# Patient Record
Sex: Female | Born: 1990 | Race: Black or African American | Hispanic: No | Marital: Single
Health system: Southern US, Community
[De-identification: ages and names within clinical notes are randomized; demographics above are authoritative.]

---

## 2008-05-16 ENCOUNTER — Ambulatory Visit (HOSPITAL_COMMUNITY): Admission: RE | Admit: 2008-05-16 | Discharge: 2008-05-16 | Payer: Self-pay | Admitting: Obstetrics and Gynecology

## 2011-02-16 ENCOUNTER — Other Ambulatory Visit (HOSPITAL_COMMUNITY)
Admission: RE | Admit: 2011-02-16 | Discharge: 2011-02-16 | Disposition: A | Discharge status: Home / Self Care | Payer: BC Managed Care – PPO | Source: Ambulatory Visit | Attending: Obstetrics and Gynecology | Admitting: Obstetrics and Gynecology

## 2011-02-16 DIAGNOSIS — Z113 Encounter for screening for infections with a predominantly sexual mode of transmission: Secondary | ICD-10-CM | POA: Insufficient documentation

## 2011-02-16 DIAGNOSIS — Z01419 Encounter for gynecological examination (general) (routine) without abnormal findings: Secondary | ICD-10-CM | POA: Insufficient documentation

## 2019-05-21 ENCOUNTER — Other Ambulatory Visit (HOSPITAL_COMMUNITY)
Admission: RE | Admit: 2019-05-21 | Discharge: 2019-05-21 | Disposition: A | Discharge status: Home / Self Care | Payer: Self-pay | Source: Ambulatory Visit | Attending: Obstetrics and Gynecology | Admitting: Obstetrics and Gynecology

## 2019-05-21 ENCOUNTER — Other Ambulatory Visit: Payer: Self-pay | Admitting: Obstetrics and Gynecology

## 2019-05-21 DIAGNOSIS — Z124 Encounter for screening for malignant neoplasm of cervix: Secondary | ICD-10-CM | POA: Insufficient documentation

## 2019-05-28 LAB — CYTOLOGY - PAP
Comment: NEGATIVE
Diagnosis: NEGATIVE
High risk HPV: NEGATIVE

## 2020-05-28 ENCOUNTER — Other Ambulatory Visit: Payer: Self-pay | Admitting: Obstetrics and Gynecology

## 2020-05-28 DIAGNOSIS — N6320 Unspecified lump in the left breast, unspecified quadrant: Secondary | ICD-10-CM

## 2020-06-19 ENCOUNTER — Other Ambulatory Visit: Payer: Self-pay | Admitting: Obstetrics and Gynecology

## 2020-06-19 ENCOUNTER — Other Ambulatory Visit: Payer: Self-pay

## 2020-06-19 ENCOUNTER — Ambulatory Visit
Admission: RE | Admit: 2020-06-19 | Discharge: 2020-06-19 | Disposition: A | Discharge status: Home / Self Care | Payer: Medicaid Other | Source: Ambulatory Visit | Attending: Obstetrics and Gynecology | Admitting: Obstetrics and Gynecology

## 2020-06-19 ENCOUNTER — Ambulatory Visit
Admission: RE | Admit: 2020-06-19 | Discharge: 2020-06-19 | Disposition: A | Discharge status: Home / Self Care | Payer: No Typology Code available for payment source | Source: Ambulatory Visit | Attending: Obstetrics and Gynecology | Admitting: Obstetrics and Gynecology

## 2020-06-19 DIAGNOSIS — N6310 Unspecified lump in the right breast, unspecified quadrant: Secondary | ICD-10-CM

## 2020-06-19 DIAGNOSIS — N6320 Unspecified lump in the left breast, unspecified quadrant: Secondary | ICD-10-CM

## 2020-06-19 DIAGNOSIS — R599 Enlarged lymph nodes, unspecified: Secondary | ICD-10-CM

## 2020-06-30 ENCOUNTER — Other Ambulatory Visit: Payer: Self-pay | Admitting: Obstetrics and Gynecology

## 2020-06-30 DIAGNOSIS — R599 Enlarged lymph nodes, unspecified: Secondary | ICD-10-CM

## 2020-06-30 DIAGNOSIS — N6320 Unspecified lump in the left breast, unspecified quadrant: Secondary | ICD-10-CM

## 2020-07-06 ENCOUNTER — Other Ambulatory Visit: Payer: Self-pay

## 2020-07-06 ENCOUNTER — Other Ambulatory Visit: Payer: Self-pay | Admitting: Obstetrics and Gynecology

## 2020-07-06 ENCOUNTER — Ambulatory Visit
Admission: RE | Admit: 2020-07-06 | Discharge: 2020-07-06 | Disposition: A | Discharge status: Home / Self Care | Payer: No Typology Code available for payment source | Source: Ambulatory Visit | Attending: Obstetrics and Gynecology | Admitting: Obstetrics and Gynecology

## 2020-07-06 DIAGNOSIS — R599 Enlarged lymph nodes, unspecified: Secondary | ICD-10-CM

## 2020-07-06 DIAGNOSIS — N6320 Unspecified lump in the left breast, unspecified quadrant: Secondary | ICD-10-CM

## 2020-07-06 DIAGNOSIS — N6001 Solitary cyst of right breast: Secondary | ICD-10-CM

## 2020-10-07 ENCOUNTER — Other Ambulatory Visit: Payer: Self-pay

## 2020-10-07 ENCOUNTER — Ambulatory Visit
Admission: RE | Admit: 2020-10-07 | Discharge: 2020-10-07 | Disposition: A | Discharge status: Home / Self Care | Payer: No Typology Code available for payment source | Source: Ambulatory Visit | Attending: Obstetrics and Gynecology | Admitting: Obstetrics and Gynecology

## 2020-10-07 ENCOUNTER — Other Ambulatory Visit: Payer: Self-pay | Admitting: Obstetrics and Gynecology

## 2020-10-07 DIAGNOSIS — R599 Enlarged lymph nodes, unspecified: Secondary | ICD-10-CM

## 2020-10-07 DIAGNOSIS — N6001 Solitary cyst of right breast: Secondary | ICD-10-CM

## 2021-01-08 ENCOUNTER — Other Ambulatory Visit: Payer: Self-pay | Admitting: Obstetrics and Gynecology

## 2021-01-08 ENCOUNTER — Ambulatory Visit
Admission: RE | Admit: 2021-01-08 | Discharge: 2021-01-08 | Disposition: A | Discharge status: Home / Self Care | Payer: No Typology Code available for payment source | Source: Ambulatory Visit | Attending: Obstetrics and Gynecology | Admitting: Obstetrics and Gynecology

## 2021-01-08 ENCOUNTER — Other Ambulatory Visit: Payer: Self-pay

## 2021-01-08 DIAGNOSIS — R599 Enlarged lymph nodes, unspecified: Secondary | ICD-10-CM

## 2021-01-08 DIAGNOSIS — N6310 Unspecified lump in the right breast, unspecified quadrant: Secondary | ICD-10-CM

## 2021-04-16 ENCOUNTER — Other Ambulatory Visit: Payer: Self-pay

## 2021-04-16 ENCOUNTER — Ambulatory Visit
Admission: RE | Admit: 2021-04-16 | Discharge: 2021-04-16 | Disposition: A | Discharge status: Home / Self Care | Payer: No Typology Code available for payment source | Source: Ambulatory Visit | Attending: Obstetrics and Gynecology | Admitting: Obstetrics and Gynecology

## 2021-04-16 DIAGNOSIS — R599 Enlarged lymph nodes, unspecified: Secondary | ICD-10-CM

## 2021-06-03 ENCOUNTER — Telehealth: Payer: Self-pay | Admitting: Genetic Counselor

## 2021-06-03 NOTE — Telephone Encounter (Signed)
Scheduled appt per 10/25 referral. Pt is aware of appt date and time.  

## 2021-07-05 NOTE — Progress Notes (Signed)
REFERRING PROVIDER: Drema Dallas, DO [address],  [place] [address]  PRIMARY PROVIDER:  Patient, No Pcp Per (Inactive)  PRIMARY REASON FOR VISIT:  Encounter Diagnoses  Name Primary?   Family history of breast cancer    Family history of colon cancer in father      HISTORY OF PRESENT ILLNESS:   Ms. Pownall, a 30 y.o. female, was seen for a [place] cancer genetics consultation at the request of Dr. Delora Fuel due to a family history of cancer.  Ms. Furniss presents to clinic today to discuss the possibility of a hereditary predisposition to cancer, to discuss genetic testing, and to further clarify her future cancer risks, as well as potential cancer risks for family members.   Ms. Hornbeck is a 30 y.o. female with no personal history of cancer.    CANCER HISTORY:  Oncology History   No history exists.    No past medical history on file.   Social History   Socioeconomic History   Marital status: Single    Spouse name: Not on file   Number of children: Not on file   Years of education: Not on file   Highest education level: Not on file  Occupational History   Not on file  Tobacco Use   Smoking status: Not on file   Smokeless tobacco: Not on file  Substance and Sexual Activity   Alcohol use: Not on file   Drug use: Not on file   Sexual activity: Not on file  Other Topics Concern   Not on file  Social History Narrative   Not on file   Social Determinants of Health   Financial Resource Strain: Not on file  Food Insecurity: Not on file  Transportation Needs: Not on file  Physical Activity: Not on file  Stress: Not on file  Social Connections: Not on file     FAMILY HISTORY:  We obtained a detailed, 4-generation family history.  Significant diagnoses are listed below: Family History  Problem Relation Age of Onset   Colon cancer Father    Breast cancer Maternal Grandmother        Ms. Vaquerano father was diagnosed  with stage IV colon cancer at age 67 and died due to the colon cancer months later. Her maternal grandmother was diagnosed with breast cancer in her late 50s/early 92s.  Ms. Zinda is unaware of previous family history of genetic testing for hereditary cancer risks. There is no reported Ashkenazi Jewish ancestry.   GENETIC COUNSELING ASSESSMENT: Ms. Shanley is a 30 y.o. female with a family history of cancer which is somewhat suggestive of a hereditary predisposition to cancer. We, therefore, discussed and recommended the following at today's visit.   DISCUSSION: We discussed that 5 - 10% of cancer is hereditary and there are many genes that can be associated with hereditary colon and breast cancer syndromes.  We discussed that testing is beneficial for several reasons, including knowing about other cancer risks, identifying potential screening and risk-reduction options that may be appropriate, and to understanding if other family members could be at risk for cancer and allowing them to undergo genetic testing.  We reviewed the characteristics, features and inheritance patterns of hereditary cancer syndromes. We also discussed genetic testing, including the appropriate family members to test, the process of testing, insurance coverage and turn-around-time for results. We discussed the implications of a negative, positive, carrier and/or variant of uncertain significant result. We discussed that negative results would be uninformative  given that Ms. Ferreras does not have a personal history of cancer.   Ms. Yorks was offered a common hereditary cancer panel (47 genes) and an expanded pan-cancer panel (84 genes). Ms. Salah was informed of the benefits and limitations of each panel, including that expanded pan-cancer panels contain several genes that do not have clear management guidelines at this point in time.  We also discussed that as the number of genes included on a panel  increases, the chances of variants of uncertain significance increases.  After considering the benefits and limitations of each gene panel, Ms. Olexa elected to have an [place] Panel+RNA.  The CancerNext-Expanded gene panel offered by [facility] and includes sequencing, rearrangement, and RNA analysis for the following 77 genes: AIP, ALK, APC, ATM, AXIN2, BAP1, BARD1, BLM, BMPR1A, BRCA1, BRCA2, BRIP1, CDC73, CDH1, CDK4, CDKN1B, CDKN2A, CHEK2, CTNNA1, DICER1, FANCC, FH, FLCN, GALNT12, KIF1B, LZTR1, MAX, MEN1, MET, MLH1, MSH2, MSH3, MSH6, MUTYH, NBN, NF1, NF2, NTHL1, PALB2, PHOX2B, PMS2, POT1, PRKAR1A, PTCH1, PTEN, RAD51C, RAD51D, RB1, RECQL, RET, SDHA, SDHAF2, SDHB, SDHC, SDHD, SMAD4, SMARCA4, SMARCB1, SMARCE1, STK11, SUFU, TMEM127, TP53, TSC1, TSC2, VHL and XRCC2 (sequencing and deletion/duplication); EGFR, EGLN1, HOXB13, KIT, MITF, PDGFRA, POLD1, and POLE (sequencing only); EPCAM and GREM1 (deletion/duplication only).   We discussed with Ms. Reigel that the family history does not meet insurance or NCCN criteria for genetic testing and, therefore, is not highly consistent with a familial hereditary cancer syndrome.  We feel she is at low risk to harbor a gene mutation associated with such a condition. However, Ms. Perrault was still interested in genetic testing. As her father has passed and we are unable to test him, it is reasonable for her to consider genetic testing.   We discussed that if her out of pocket cost for testing is over $100, the laboratory will call and confirm whether she wants to proceed with testing.  If the out of pocket cost of testing is less than $100 she will be billed by the genetic testing laboratory.   We discussed that some people do not want to undergo genetic testing due to fear of genetic discrimination.  A federal law called the Genetic Information Non-Discrimination Act (GINA) of 2008 helps protect individuals against genetic  discrimination based on their genetic test results.  It impacts both health insurance and employment.  With health insurance, it protects against increased premiums, being kicked off insurance or being forced to take a test in order to be insured.  For employment it protects against hiring, firing and promoting decisions based on genetic test results.  GINA does not apply to those in the [employer], those who work for companies with less than 15 employees, and new life insurance or long-term disability insurance policies.  Health status due to a cancer diagnosis is not protected under GINA.  PLAN: After considering the risks, benefits, and limitations, Ms. Holik provided informed consent to pursue genetic testing and the blood sample was sent to [facility] for analysis of the CancerNext-Expanded Panel+RNA. Results should be available within approximately 2-3 weeks' time, at which point they will be disclosed by telephone to Ms. Willadsen, as will any additional recommendations warranted by these results. Ms. Covin will receive a summary of her genetic counseling visit and a copy of her results once available. This information will also be available in Epic.   Ms. Deemer questions were answered to her satisfaction today. Our contact information was provided should additional questions or concerns arise. Thank you for  the referral and allowing [place] to share in the care of your patient.   Lucille Passy, MS, [facility] Genetic Counselor [email or web address] (P) [phone]  The patient was seen for a total of 30 minutes in face-to-face genetic counseling. The patient was seen alone.  Drs. Magrinat, Lindi Adie and/or Burr Medico were available to discuss this case as needed.  _______________________________________________________________________ For Office Staff:  Number of people involved in session: 1 Was an Intern/ student involved with case: no

## 2021-07-06 ENCOUNTER — Inpatient Hospital Stay: Payer: No Typology Code available for payment source

## 2021-07-06 ENCOUNTER — Other Ambulatory Visit: Payer: Self-pay

## 2021-07-06 ENCOUNTER — Inpatient Hospital Stay: Payer: No Typology Code available for payment source | Attending: Genetic Counselor | Admitting: Genetic Counselor

## 2021-07-06 DIAGNOSIS — Z800 Family history of malignant neoplasm of digestive organs: Secondary | ICD-10-CM

## 2021-07-06 DIAGNOSIS — Z803 Family history of malignant neoplasm of breast: Secondary | ICD-10-CM

## 2021-07-06 LAB — GENETIC SCREENING ORDER

## 2021-07-07 ENCOUNTER — Encounter: Payer: Self-pay | Admitting: Genetic Counselor

## 2021-07-07 DIAGNOSIS — Z803 Family history of malignant neoplasm of breast: Secondary | ICD-10-CM | POA: Insufficient documentation

## 2021-07-07 DIAGNOSIS — Z800 Family history of malignant neoplasm of digestive organs: Secondary | ICD-10-CM | POA: Insufficient documentation

## 2021-07-16 ENCOUNTER — Ambulatory Visit
Admission: RE | Admit: 2021-07-16 | Discharge: 2021-07-16 | Disposition: A | Discharge status: Home / Self Care | Payer: No Typology Code available for payment source | Source: Ambulatory Visit | Attending: Obstetrics and Gynecology | Admitting: Obstetrics and Gynecology

## 2021-07-16 DIAGNOSIS — N6310 Unspecified lump in the right breast, unspecified quadrant: Secondary | ICD-10-CM

## 2021-07-19 ENCOUNTER — Encounter: Payer: Self-pay | Admitting: Genetic Counselor

## 2021-07-19 ENCOUNTER — Telehealth: Payer: Self-pay | Admitting: Genetic Counselor

## 2021-07-19 DIAGNOSIS — Z1379 Encounter for other screening for genetic and chromosomal anomalies: Secondary | ICD-10-CM | POA: Insufficient documentation

## 2021-07-19 NOTE — Telephone Encounter (Signed)
I contacted Ms. Whirley to discuss her genetic testing results. No pathogenic variants were identified in the 77 genes analyzed.   The test report has been scanned into EPIC and is located under the Molecular Pathology section of the Results Review tab.  A portion of the result report is included below for reference. Detailed clinic note to follow.  Lalla Brothers, MS, [facility] Genetic Counselor [email or web address] (P) [phone]

## 2021-07-20 ENCOUNTER — Ambulatory Visit: Payer: Self-pay | Admitting: Genetic Counselor

## 2021-07-20 DIAGNOSIS — Z1379 Encounter for other screening for genetic and chromosomal anomalies: Secondary | ICD-10-CM

## 2021-07-20 NOTE — Progress Notes (Signed)
HPI:   Angel Stanton was previously seen in the [place] clinic due to a family history of cancer and concerns regarding a hereditary predisposition to cancer. Please refer to our prior cancer genetics clinic note for more information regarding our discussion, assessment and recommendations, at the time. Angel Stanton recent genetic test results were disclosed to Angel, as were recommendations warranted by these results. These results and recommendations are discussed in more detail below.  CANCER HISTORY:  Oncology History   No history exists.    FAMILY HISTORY:  We obtained a detailed, 4-generation family history.  Significant diagnoses are listed below:      Family History  Problem Relation Age of Onset   Colon cancer Father     Breast cancer Maternal Grandmother            Angel Stanton father was diagnosed with stage IV colon cancer at age 37 and died due to the colon cancer months later. Angel maternal grandmother was diagnosed with breast cancer in Angel late 50s/early 98s.   Angel Stanton is unaware of previous family history of genetic testing for hereditary cancer risks. There is no reported Ashkenazi Jewish ancestry.     GENETIC TEST RESULTS:  The [facility] CancerNext-Expanded Panel found no pathogenic mutations.   The CancerNext-Expanded gene panel offered by [facility] and includes sequencing, rearrangement, and RNA analysis for the following 77 genes: AIP, ALK, APC, ATM, AXIN2, BAP1, BARD1, BLM, BMPR1A, BRCA1, BRCA2, BRIP1, CDC73, CDH1, CDK4, CDKN1B, CDKN2A, CHEK2, CTNNA1, DICER1, FANCC, FH, FLCN, GALNT12, KIF1B, LZTR1, MAX, MEN1, MET, MLH1, MSH2, MSH3, MSH6, MUTYH, NBN, NF1, NF2, NTHL1, PALB2, PHOX2B, PMS2, POT1, PRKAR1A, PTCH1, PTEN, RAD51C, RAD51D, RB1, RECQL, RET, SDHA, SDHAF2, SDHB, SDHC, SDHD, SMAD4, SMARCA4, SMARCB1, SMARCE1, STK11, SUFU, TMEM127, TP53, TSC1, TSC2, VHL and XRCC2 (sequencing and deletion/duplication); EGFR, EGLN1, HOXB13,  KIT, MITF, PDGFRA, POLD1, and POLE (sequencing only); EPCAM and GREM1 (deletion/duplication only).    The test report has been scanned into EPIC and is located under the Molecular Pathology section of the Results Review tab.  A portion of the result report is included below for reference. Genetic testing reported out on 07/18/2021.         Even though a pathogenic variant was not identified, possible explanations for the cancer in the family may include: There may be no hereditary risk for cancer in the family. The cancers in Angel family may be due to other genetic or environmental factors. There may be a gene mutation in one of these genes that current testing methods cannot detect, but that chance is small. There could be another gene that has not yet been discovered, or that we have not yet tested, that is responsible for the cancer diagnoses in the family.  It is also possible there is a hereditary cause for the cancer in the family that Angel Stanton did not inherit.  Therefore, it is important to remain in touch with cancer genetics in the future so that we can continue to offer Angel Stanton the most up to date genetic testing.   ADDITIONAL GENETIC TESTING:  We discussed with Angel Stanton that Angel genetic testing was fairly extensive.  If there are genes identified to increase cancer risk that can be analyzed in the future, we would be happy to discuss and coordinate this testing at that time.    CANCER SCREENING RECOMMENDATIONS:  Angel Stanton test result is considered negative (normal).  This means that we have not identified a hereditary cause  for Angel family history of cancer at this time.   An individual's cancer risk and medical management are not determined by genetic test results alone. Overall cancer risk assessment incorporates additional factors, including personal medical history, family history, and any available genetic information that may result in a  personalized plan for cancer prevention and surveillance. Therefore, it is recommended she continue to follow the cancer management and screening guidelines provided by Angel primary healthcare provider.  Based on the reported personal and family history, specific cancer screenings for Angel Stanton include:  Colon Cancer Screening: Due to Angel Stanton's father's history of colon cancer, she is recommended to begin colonoscopies at age 89 and repeat every 5 years. More frequent colonoscopies may be recommended if polyps are identified.  RECOMMENDATIONS FOR FAMILY MEMBERS:   Since she did not inherit a mutation in a cancer predisposition gene included on this panel, Angel Stanton could not have inherited a mutation from Angel in one of these genes.  FOLLOW-UP:  Cancer genetics is a rapidly advancing field and it is possible that new genetic tests will be appropriate for Angel and/or Angel family members in the future. We encouraged Angel to remain in contact with cancer genetics on an annual basis so we can update Angel personal and family histories and let Angel know of advances in cancer genetics that may benefit this family.   Our contact number was provided. Angel Stanton questions were answered to Angel satisfaction, and she knows she is welcome to call us at anytime with additional questions or concerns.   Angel Passy, MS, [facility] Genetic Counselor [email or web address] (P) [phone]

## 2021-07-29 ENCOUNTER — Other Ambulatory Visit: Payer: No Typology Code available for payment source

## 2021-07-29 ENCOUNTER — Encounter: Payer: No Typology Code available for payment source | Admitting: Genetic Counselor

## 2021-11-19 IMAGING — US US BREAST*R* LIMITED INC AXILLA
1 series · 8 of 8 positions shown · non-contrast
Comparison: Baseline exam

CLINICAL DATA: Palpable abnormality in the LEFT breast. Patient had
Covid vaccines in LEFT arm this past [REDACTED].

EXAM:
DIGITAL DIAGNOSTIC BILATERAL MAMMOGRAM WITH CAD AND TOMO
ULTRASOUND BILATERAL BREAST

[Series 1: us breast*right* limited inc axilla · 0.07mm/px · 8 of 8 slices shown]
[im 1/8]
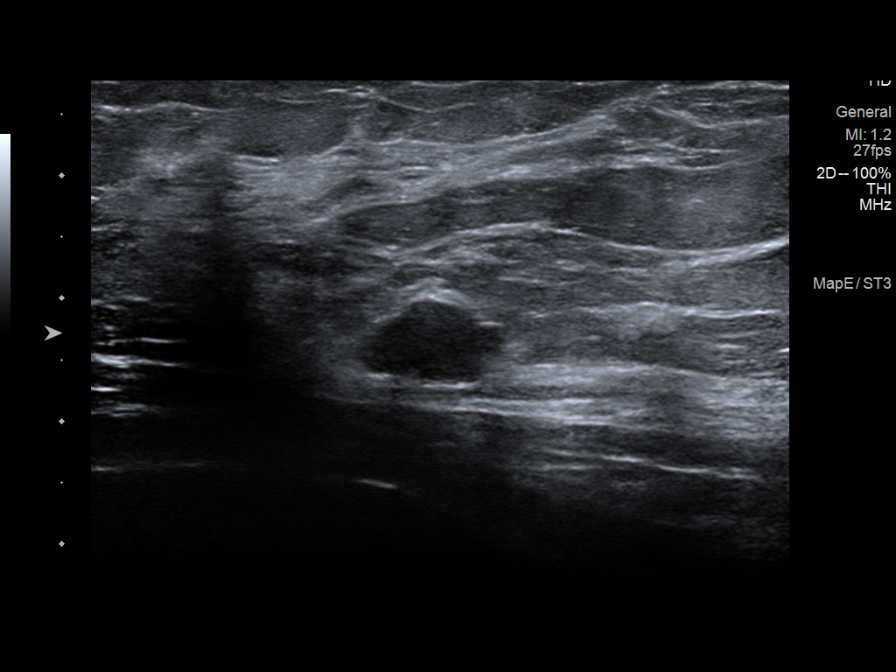
[im 2/8]
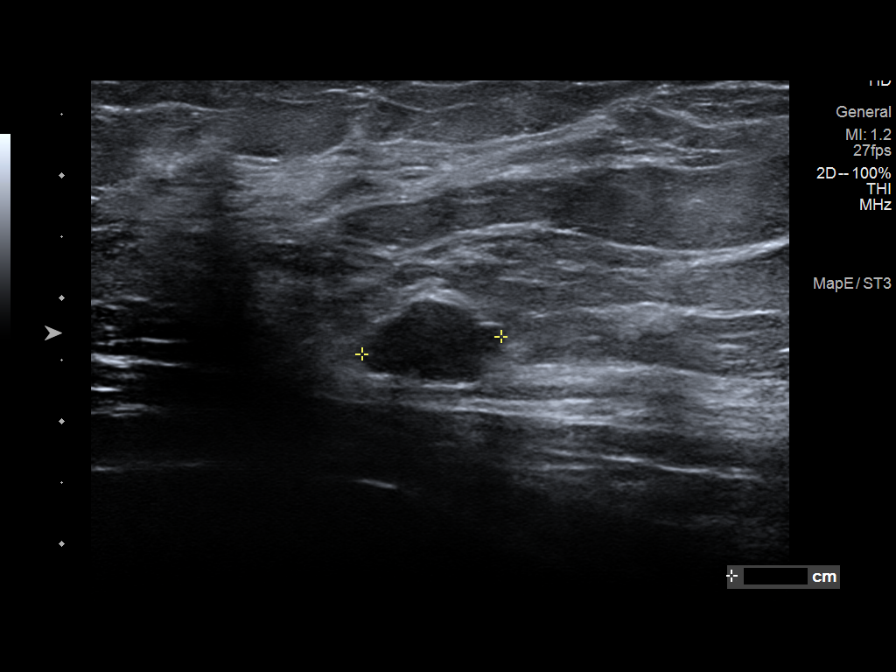
[im 3/8]
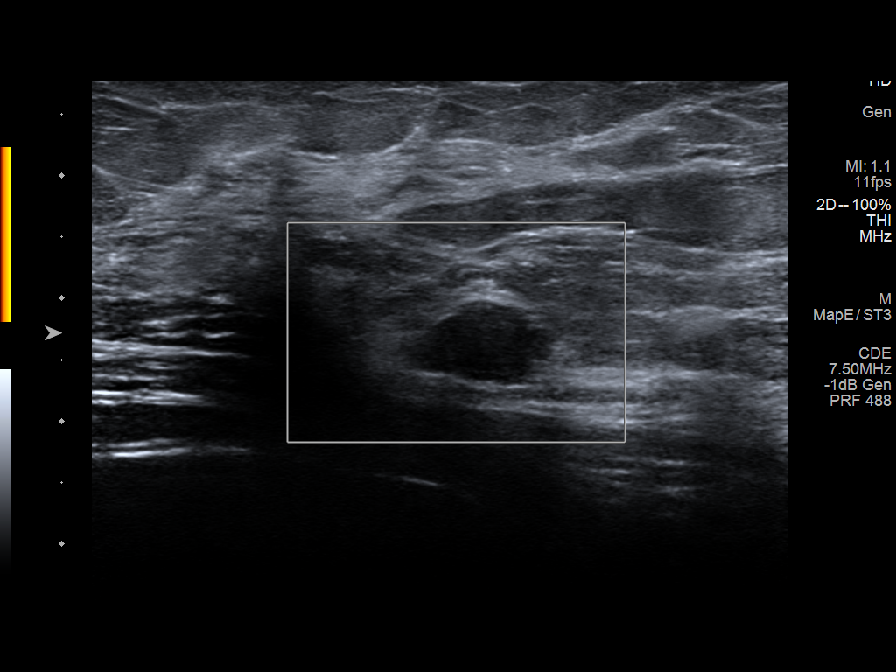
[im 4/8]
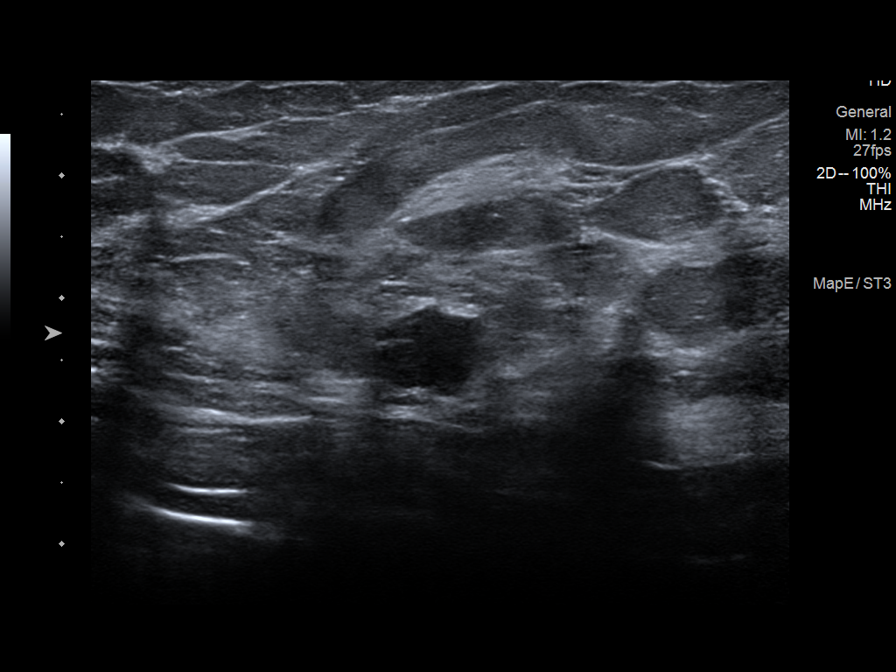
[im 5/8]
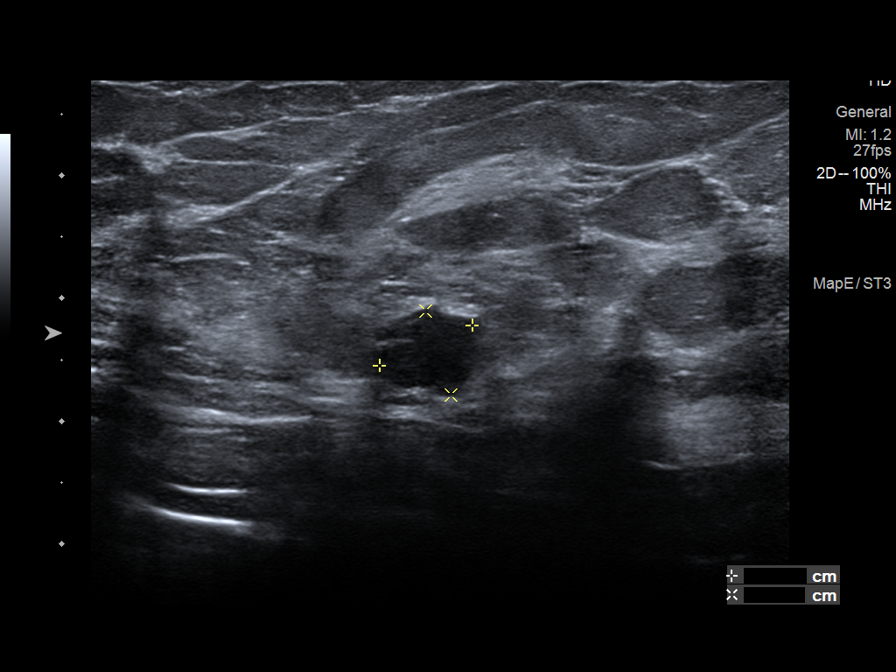
[im 6/8]
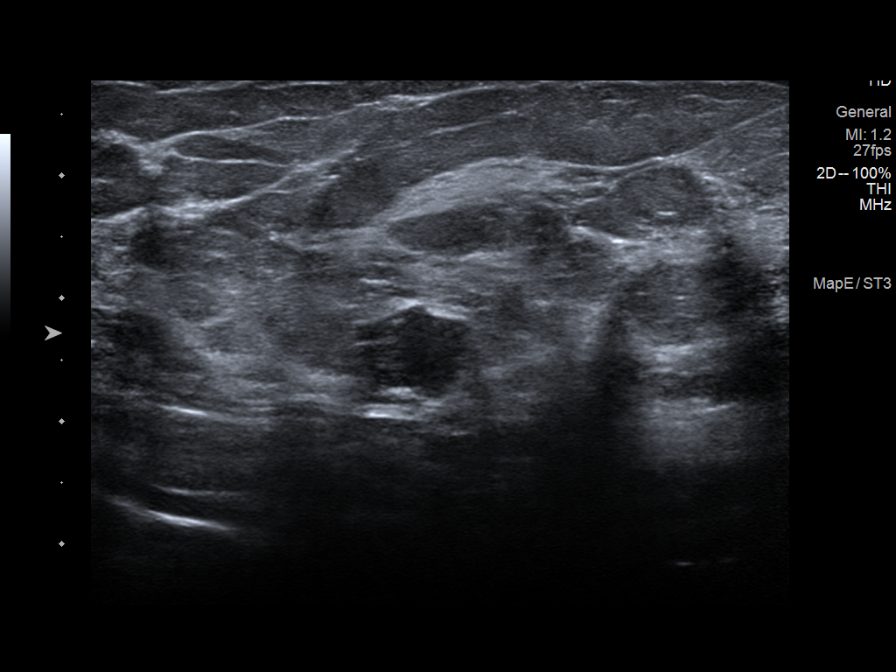
[im 7/8]
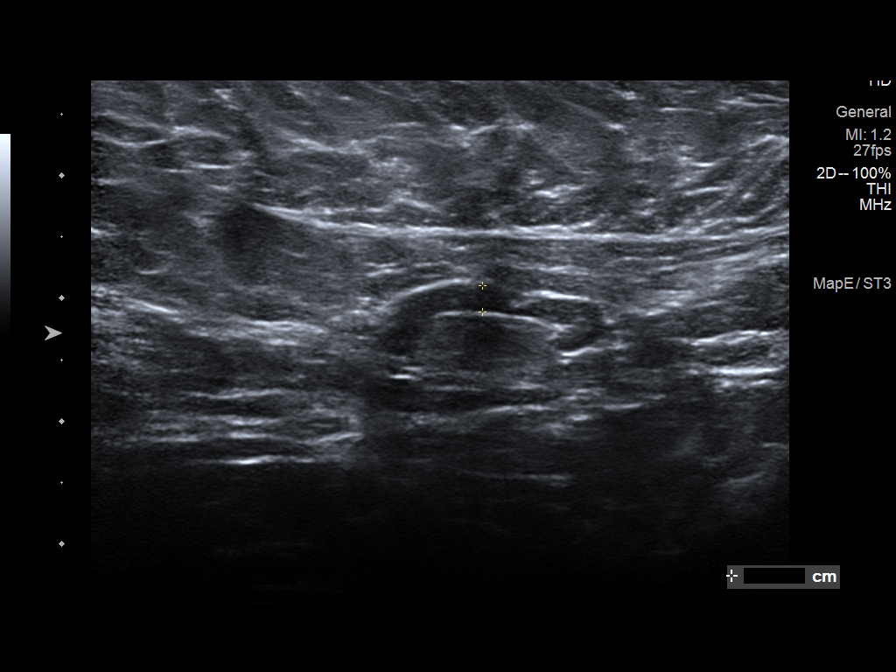
[im 8/8]
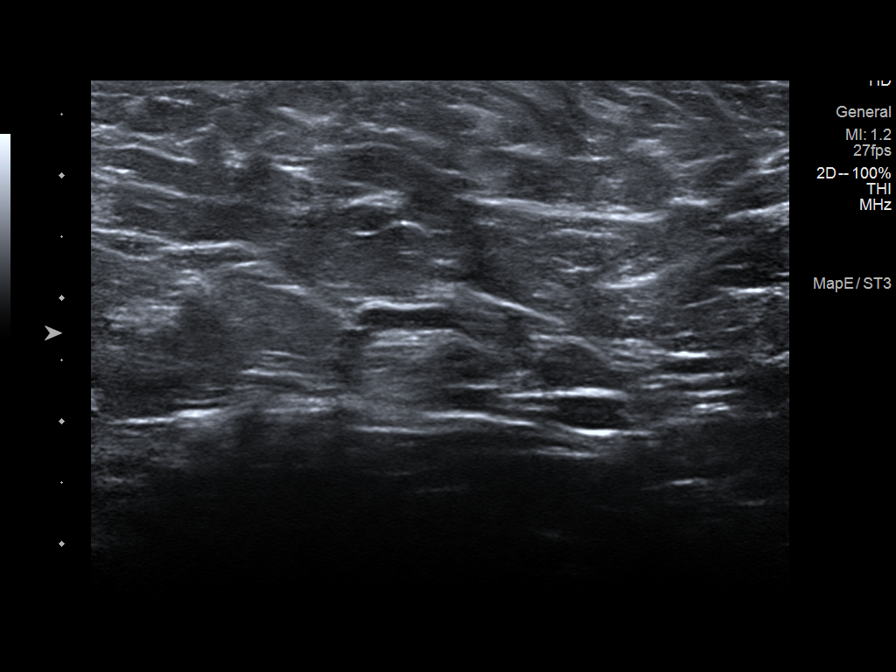

[8 of 8 positions shown; findings below may reference images not displayed]

ACR Breast Density Category c: The breast tissue is heterogeneously
dense, which may obscure small masses.
FINDINGS: RIGHT BREAST:

Mammogram: A circumscribed oval mass is identified in the LATERAL
portion of the RIGHT breast and further evaluated with ultrasound.
Mammographic images were processed with CAD.

Ultrasound: Targeted ultrasound is performed, showing a
circumscribed oval parallel mass in the 10 o'clock location of the
RIGHT breast 10 centimeters from the nipple not associated internal
blood flow. Lesion measures 0.8 x 0.7 x 1.1 centimeters.

Evaluation of the RIGHT axilla shows a single lymph node with
cortical thickening of 8 millimeters.

LEFT BREAST:

Mammogram: There is a circumscribed oval mass in the anterior
portion of the LEFT breast marked as palpable with BB. Mammographic
images were processed with CAD.

Physical Exam: I palpate a mass in the retroareolar region of the
LEFT breast corresponding to the area of patient's concern.

Ultrasound: Targeted ultrasound is performed, showing a
circumscribed anechoic oval mass in the 10 o'clock retroareolar
region of the LEFT breast. Mass is taller than wide, measuring 1.6 x
1.3 x 1.6 centimeters. Power Doppler demonstrates significant,
reproducible flow within the lesion. At real-time evaluation, there
is no evidence for mobile internal debris.

Evaluation of the LEFT axilla shows lymph nodes with thickened
cortex, most prominent node with cortical thickening of 10
millimeters in the deep inferior LEFT axilla. The more superficial
lymph node has cortical thickening of 5 millimeters.
IMPRESSION: 1. Indeterminate mass in the 10 o'clock retroareolar region of the
LEFT breast warranting tissue diagnosis.
2. Enlarged LEFT axillary lymph nodes.
3. Probably benign mass in the 10 o'clock location of the RIGHT
breast.
4. Prominent RIGHT axillary lymph node.

RECOMMENDATION:
1. Consider ultrasound-guided aspiration versus core biopsy of the
mass in the 10 o'clock location of the LEFT breast.
2. If LEFT breast lesion is solid at biopsy, recommend
ultrasound-guided core biopsy of LEFT axillary lymph node.
3. If LEFT breast lesion is malignant, recommend ultrasound-guided
core biopsy of mass in the 10 o'clock location of the RIGHT breast
as well as biopsy of RIGHT axillary lymph node.

I have discussed the findings and recommendations with the patient.
If applicable, a reminder letter will be sent to the patient
regarding the next appointment.

BI-RADS CATEGORY  4: Suspicious.

## 2021-11-19 IMAGING — US US BREAST*L* LIMITED INC AXILLA
1 series · 13 of 25 positions shown · non-contrast
Comparison: Baseline exam

CLINICAL DATA: Palpable abnormality in the LEFT breast. Patient had
Covid vaccines in LEFT arm this past [REDACTED].

EXAM:
DIGITAL DIAGNOSTIC BILATERAL MAMMOGRAM WITH CAD AND TOMO
ULTRASOUND BILATERAL BREAST

[Series 1: us breast*left* limited inc axilla · 0.06mm/px · 13 of 25 slices shown]
[im 1/25]
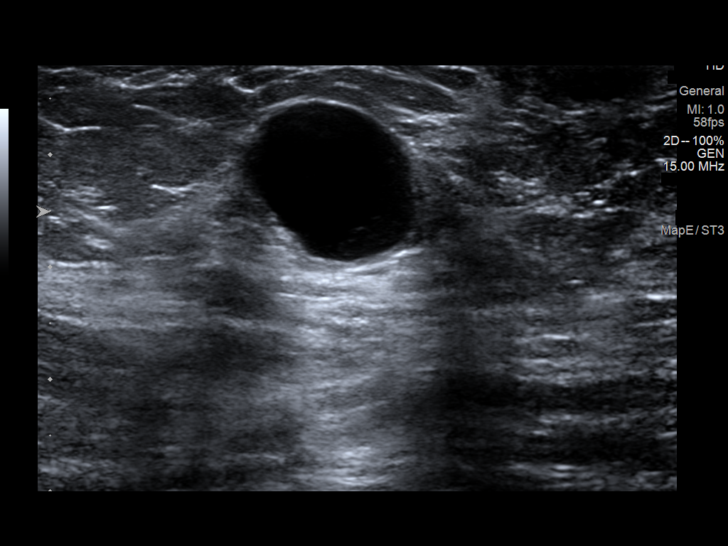
[im 3/25]
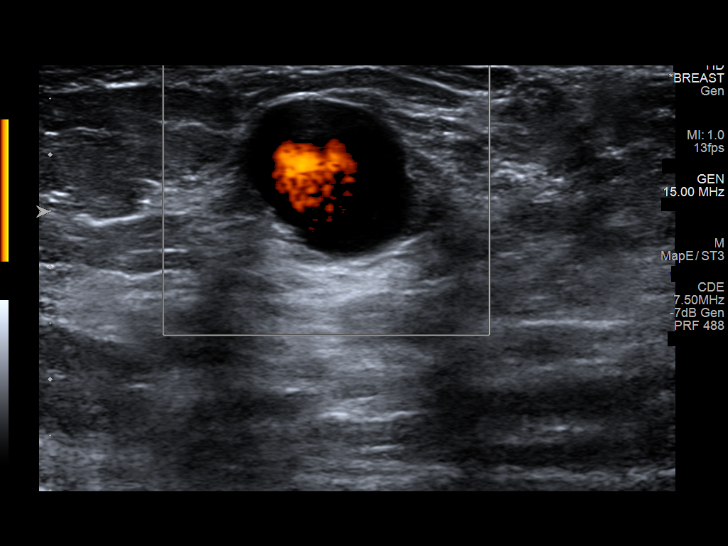
[im 5/25]
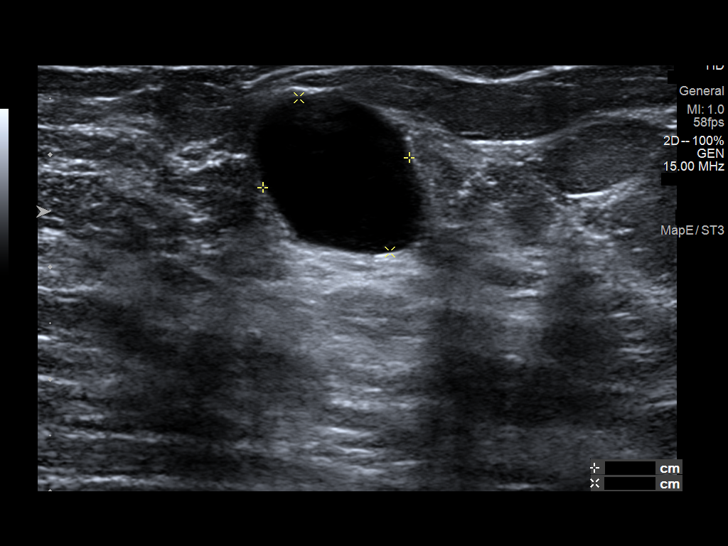
[im 7/25]
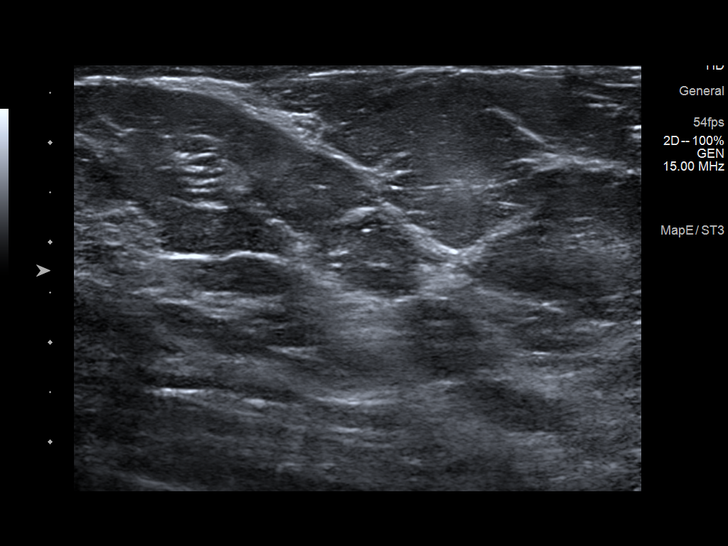
[im 9/25]
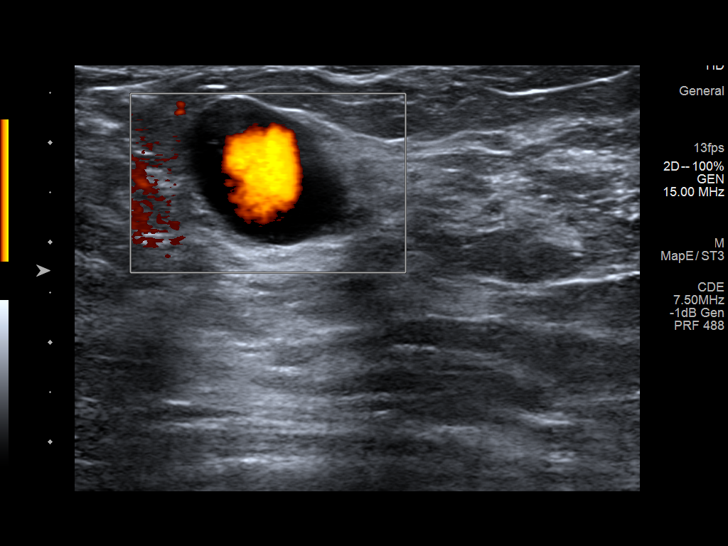
[im 11/25]
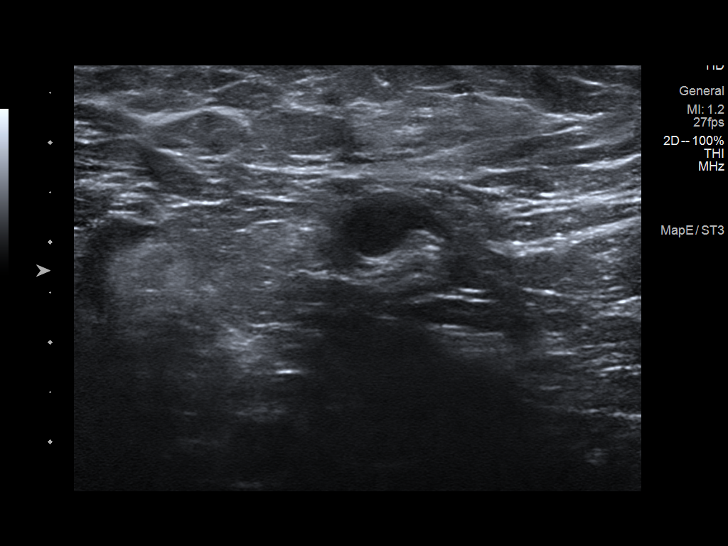
[im 13/25]
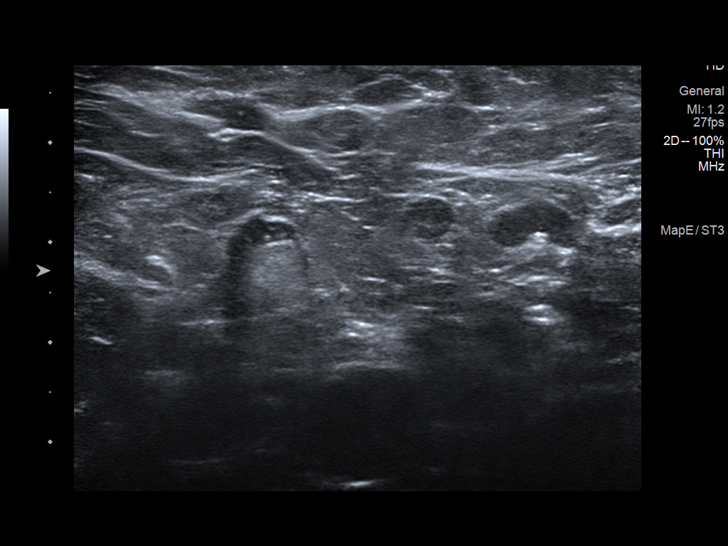
[im 15/25]
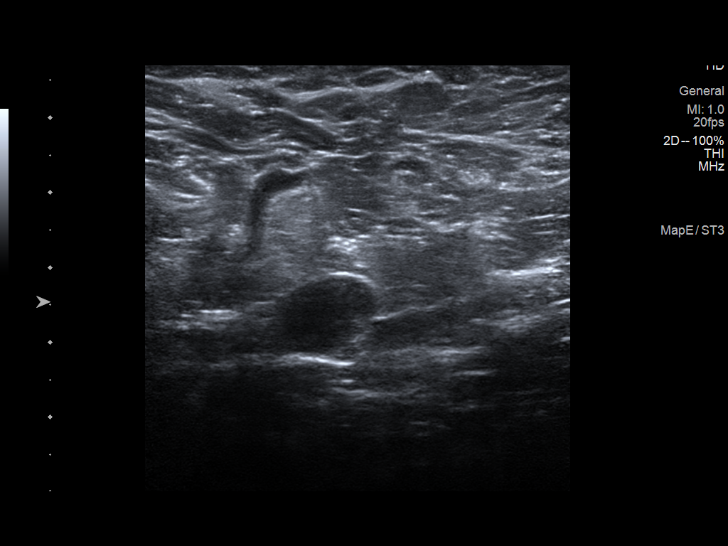
[im 17/25]
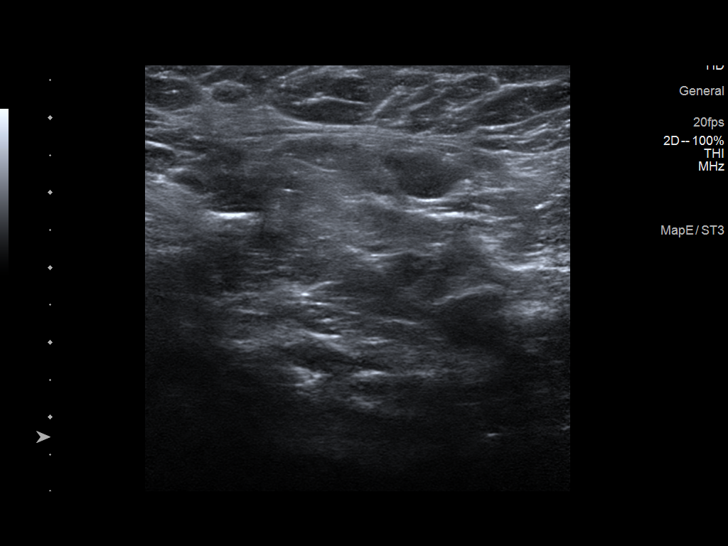
[im 19/25]
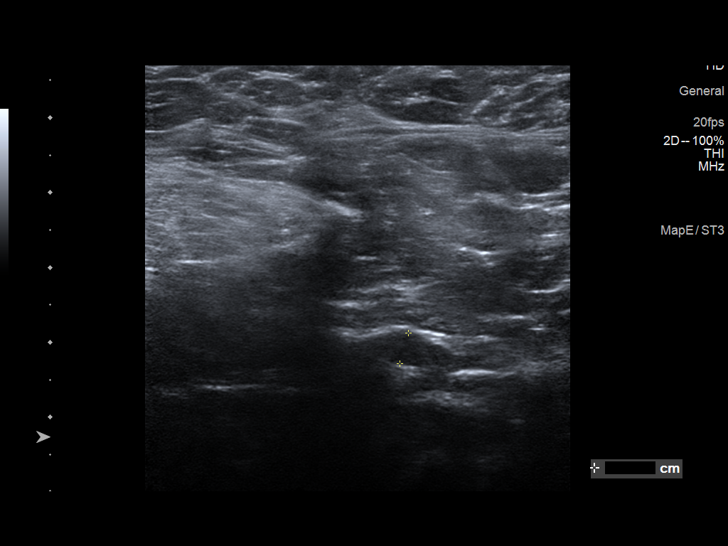
[im 21/25]
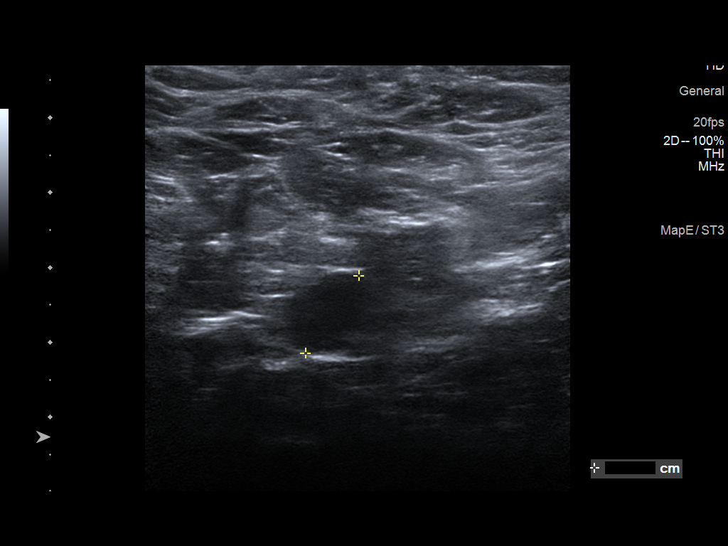
[im 23/25]
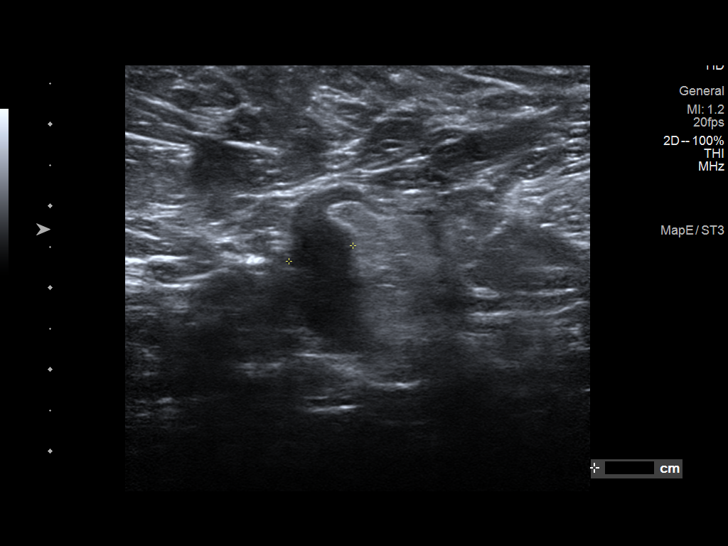
[im 25/25]
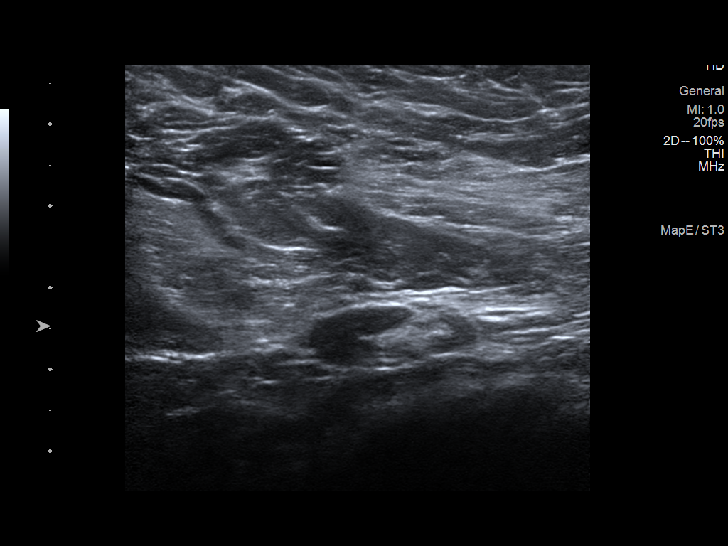

[13 of 25 positions shown; findings below may reference images not displayed]

ACR Breast Density Category c: The breast tissue is heterogeneously
dense, which may obscure small masses.
FINDINGS: RIGHT BREAST:

Mammogram: A circumscribed oval mass is identified in the LATERAL
portion of the RIGHT breast and further evaluated with ultrasound.
Mammographic images were processed with CAD.

Ultrasound: Targeted ultrasound is performed, showing a
circumscribed oval parallel mass in the 10 o'clock location of the
RIGHT breast 10 centimeters from the nipple not associated internal
blood flow. Lesion measures 0.8 x 0.7 x 1.1 centimeters.

Evaluation of the RIGHT axilla shows a single lymph node with
cortical thickening of 8 millimeters.

LEFT BREAST:

Mammogram: There is a circumscribed oval mass in the anterior
portion of the LEFT breast marked as palpable with BB. Mammographic
images were processed with CAD.

Physical Exam: I palpate a mass in the retroareolar region of the
LEFT breast corresponding to the area of patient's concern.

Ultrasound: Targeted ultrasound is performed, showing a
circumscribed anechoic oval mass in the 10 o'clock retroareolar
region of the LEFT breast. Mass is taller than wide, measuring 1.6 x
1.3 x 1.6 centimeters. Power Doppler demonstrates significant,
reproducible flow within the lesion. At real-time evaluation, there
is no evidence for mobile internal debris.

Evaluation of the LEFT axilla shows lymph nodes with thickened
cortex, most prominent node with cortical thickening of 10
millimeters in the deep inferior LEFT axilla. The more superficial
lymph node has cortical thickening of 5 millimeters.
IMPRESSION: 1. Indeterminate mass in the 10 o'clock retroareolar region of the
LEFT breast warranting tissue diagnosis.
2. Enlarged LEFT axillary lymph nodes.
3. Probably benign mass in the 10 o'clock location of the RIGHT
breast.
4. Prominent RIGHT axillary lymph node.

RECOMMENDATION:
1. Consider ultrasound-guided aspiration versus core biopsy of the
mass in the 10 o'clock location of the LEFT breast.
2. If LEFT breast lesion is solid at biopsy, recommend
ultrasound-guided core biopsy of LEFT axillary lymph node.
3. If LEFT breast lesion is malignant, recommend ultrasound-guided
core biopsy of mass in the 10 o'clock location of the RIGHT breast
as well as biopsy of RIGHT axillary lymph node.

I have discussed the findings and recommendations with the patient.
If applicable, a reminder letter will be sent to the patient
regarding the next appointment.

BI-RADS CATEGORY  4: Suspicious.

## 2021-11-19 IMAGING — MG DIGITAL DIAGNOSTIC BILAT W/ TOMO W/ CAD
5 of 10 series · 5 of 30 positions shown · non-contrast
Comparison: Baseline exam

CLINICAL DATA: Palpable abnormality in the LEFT breast. Patient had
Covid vaccines in LEFT arm this past [REDACTED].

EXAM:
DIGITAL DIAGNOSTIC BILATERAL MAMMOGRAM WITH CAD AND TOMO
ULTRASOUND BILATERAL BREAST

[R MLO synth-2D]
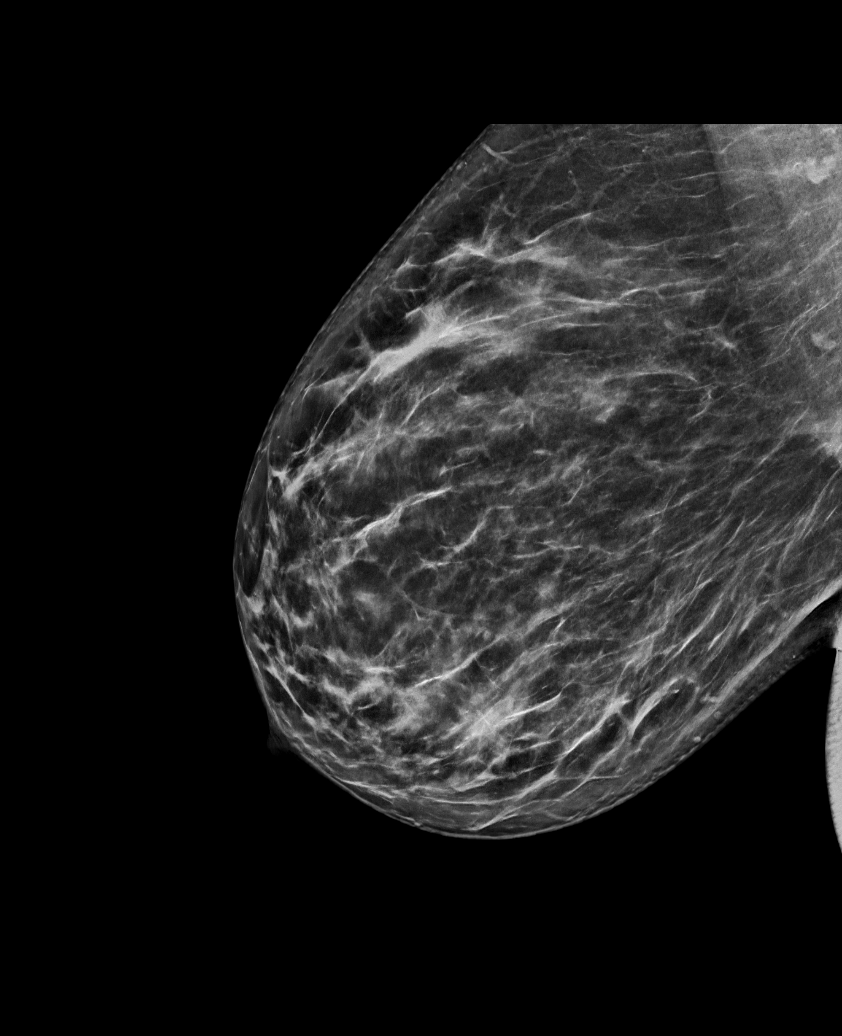

[L MLO synth-2D]
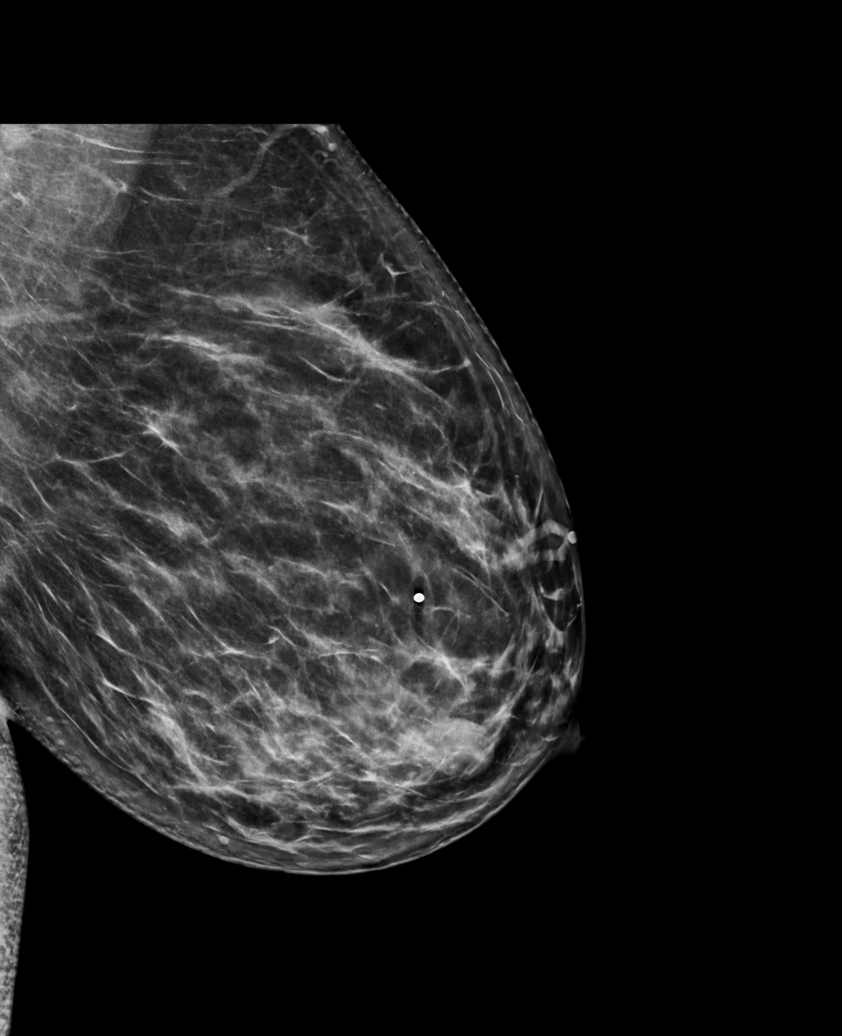

[R CC synth-2D]
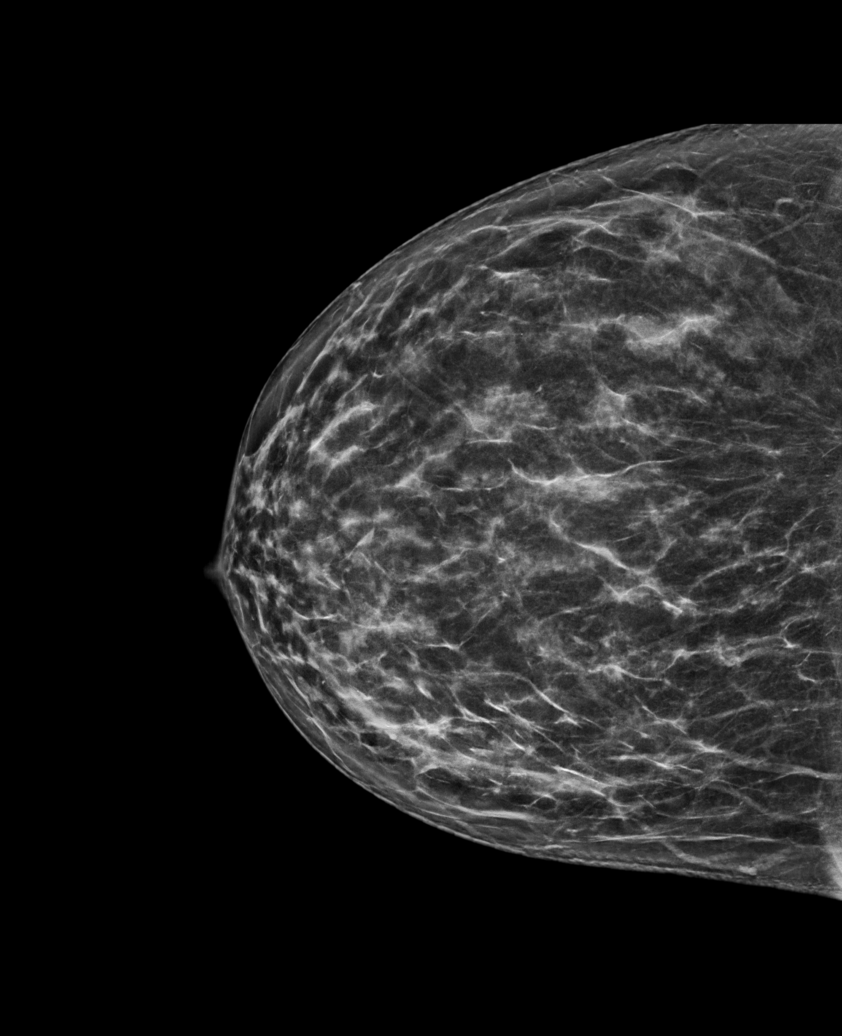

[L TAN synth-2D]
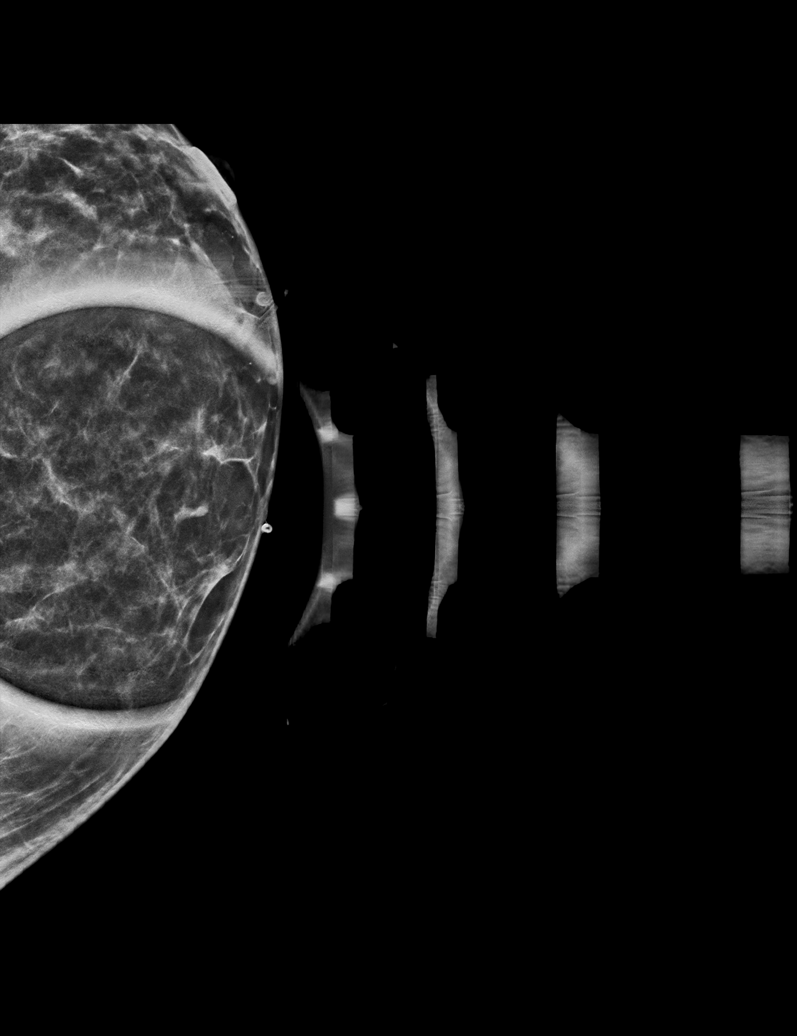

[L CC synth-2D]
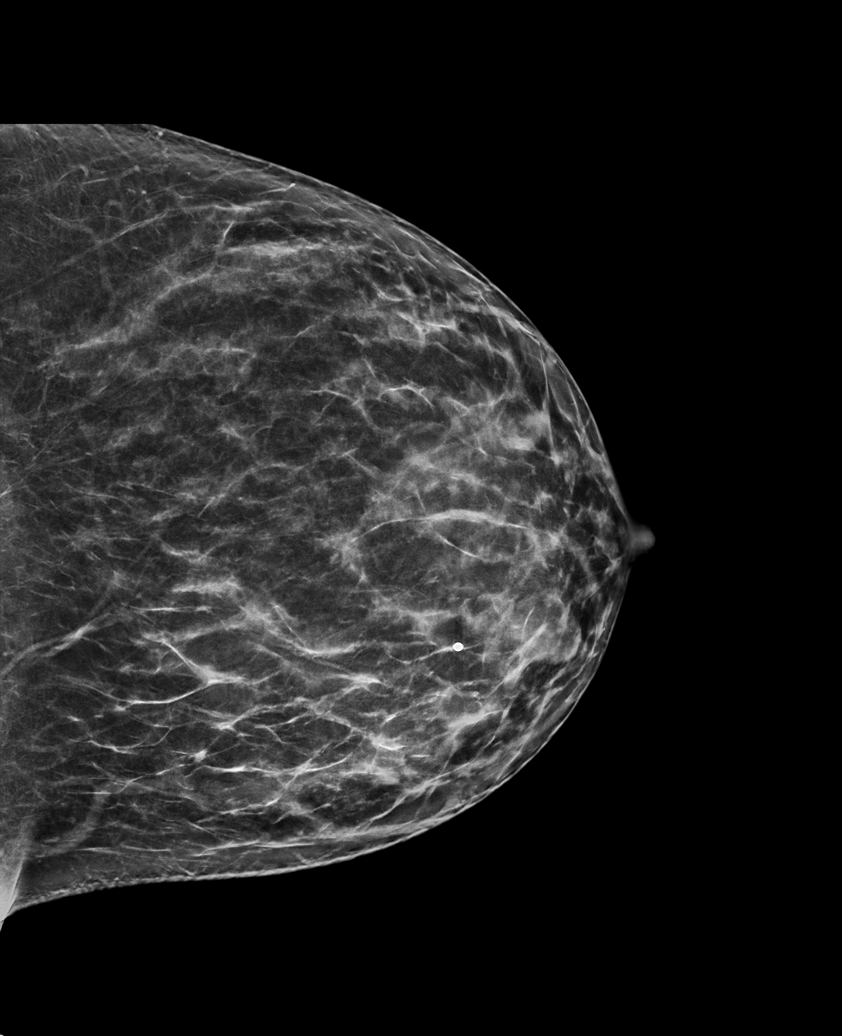

[5 of 30 positions shown; findings below may reference images not displayed]

ACR Breast Density Category c: The breast tissue is heterogeneously
dense, which may obscure small masses.
FINDINGS: RIGHT BREAST:

Mammogram: A circumscribed oval mass is identified in the LATERAL
portion of the RIGHT breast and further evaluated with ultrasound.
Mammographic images were processed with CAD.

Ultrasound: Targeted ultrasound is performed, showing a
circumscribed oval parallel mass in the 10 o'clock location of the
RIGHT breast 10 centimeters from the nipple not associated internal
blood flow. Lesion measures 0.8 x 0.7 x 1.1 centimeters.

Evaluation of the RIGHT axilla shows a single lymph node with
cortical thickening of 8 millimeters.

LEFT BREAST:

Mammogram: There is a circumscribed oval mass in the anterior
portion of the LEFT breast marked as palpable with BB. Mammographic
images were processed with CAD.

Physical Exam: I palpate a mass in the retroareolar region of the
LEFT breast corresponding to the area of patient's concern.

Ultrasound: Targeted ultrasound is performed, showing a
circumscribed anechoic oval mass in the 10 o'clock retroareolar
region of the LEFT breast. Mass is taller than wide, measuring 1.6 x
1.3 x 1.6 centimeters. Power Doppler demonstrates significant,
reproducible flow within the lesion. At real-time evaluation, there
is no evidence for mobile internal debris.

Evaluation of the LEFT axilla shows lymph nodes with thickened
cortex, most prominent node with cortical thickening of 10
millimeters in the deep inferior LEFT axilla. The more superficial
lymph node has cortical thickening of 5 millimeters.
IMPRESSION: 1. Indeterminate mass in the 10 o'clock retroareolar region of the
LEFT breast warranting tissue diagnosis.
2. Enlarged LEFT axillary lymph nodes.
3. Probably benign mass in the 10 o'clock location of the RIGHT
breast.
4. Prominent RIGHT axillary lymph node.

RECOMMENDATION:
1. Consider ultrasound-guided aspiration versus core biopsy of the
mass in the 10 o'clock location of the LEFT breast.
2. If LEFT breast lesion is solid at biopsy, recommend
ultrasound-guided core biopsy of LEFT axillary lymph node.
3. If LEFT breast lesion is malignant, recommend ultrasound-guided
core biopsy of mass in the 10 o'clock location of the RIGHT breast
as well as biopsy of RIGHT axillary lymph node.

I have discussed the findings and recommendations with the patient.
If applicable, a reminder letter will be sent to the patient
regarding the next appointment.

BI-RADS CATEGORY  4: Suspicious.

## 2022-06-01 ENCOUNTER — Other Ambulatory Visit: Payer: Self-pay | Admitting: Obstetrics and Gynecology

## 2022-06-01 DIAGNOSIS — N6310 Unspecified lump in the right breast, unspecified quadrant: Secondary | ICD-10-CM

## 2022-07-18 ENCOUNTER — Ambulatory Visit
Admission: RE | Admit: 2022-07-18 | Discharge: 2022-07-18 | Disposition: A | Discharge status: Home / Self Care | Payer: No Typology Code available for payment source | Source: Ambulatory Visit | Attending: Obstetrics and Gynecology | Admitting: Obstetrics and Gynecology

## 2022-07-18 ENCOUNTER — Other Ambulatory Visit: Payer: Self-pay | Admitting: Obstetrics and Gynecology

## 2022-07-18 DIAGNOSIS — N6310 Unspecified lump in the right breast, unspecified quadrant: Secondary | ICD-10-CM
